# Patient Record
Sex: Female | Born: 1980 | Race: Black or African American | Hispanic: No | Marital: Single | Smoking: Current some day smoker
Health system: Southern US, Community
[De-identification: ages and names within clinical notes are randomized; demographics above are authoritative.]

## PROBLEM LIST (undated history)

## (undated) ENCOUNTER — Ambulatory Visit: Source: Home / Self Care

## (undated) DIAGNOSIS — E119 Type 2 diabetes mellitus without complications: Secondary | ICD-10-CM

---

## 1999-10-18 ENCOUNTER — Emergency Department (HOSPITAL_COMMUNITY): Admission: EM | Admit: 1999-10-18 | Discharge: 1999-10-18 | Payer: Self-pay | Admitting: Emergency Medicine

## 2008-04-22 ENCOUNTER — Emergency Department (HOSPITAL_COMMUNITY): Admission: EM | Admit: 2008-04-22 | Discharge: 2008-04-22 | Payer: Self-pay | Admitting: Emergency Medicine

## 2011-07-18 LAB — POCT PREGNANCY, URINE: Operator id: 235561

## 2011-07-18 LAB — POCT RAPID STREP A: Streptococcus, Group A Screen (Direct): POSITIVE — AB

## 2016-11-15 ENCOUNTER — Emergency Department (HOSPITAL_COMMUNITY)
Admission: EM | Admit: 2016-11-15 | Discharge: 2016-11-15 | Disposition: A | Discharge status: Home / Self Care | Payer: Self-pay | Attending: Emergency Medicine | Admitting: Emergency Medicine

## 2016-11-15 ENCOUNTER — Encounter (HOSPITAL_COMMUNITY): Payer: Self-pay | Admitting: Emergency Medicine

## 2016-11-15 ENCOUNTER — Emergency Department (HOSPITAL_COMMUNITY): Payer: Self-pay

## 2016-11-15 DIAGNOSIS — Y929 Unspecified place or not applicable: Secondary | ICD-10-CM | POA: Insufficient documentation

## 2016-11-15 DIAGNOSIS — S63502A Unspecified sprain of left wrist, initial encounter: Secondary | ICD-10-CM | POA: Insufficient documentation

## 2016-11-15 DIAGNOSIS — Y939 Activity, unspecified: Secondary | ICD-10-CM | POA: Insufficient documentation

## 2016-11-15 DIAGNOSIS — X58XXXA Exposure to other specified factors, initial encounter: Secondary | ICD-10-CM | POA: Insufficient documentation

## 2016-11-15 DIAGNOSIS — F17200 Nicotine dependence, unspecified, uncomplicated: Secondary | ICD-10-CM | POA: Insufficient documentation

## 2016-11-15 DIAGNOSIS — Y999 Unspecified external cause status: Secondary | ICD-10-CM | POA: Insufficient documentation

## 2016-11-15 MED ORDER — ACETAMINOPHEN 500 MG PO TABS
1000.0000 mg | ORAL_TABLET | Freq: Once | ORAL | Status: AC
  Administered 2016-11-15: 1000 mg via ORAL
  Filled 2016-11-15: qty 2

## 2016-11-15 MED ORDER — IBUPROFEN 800 MG PO TABS
800.0000 mg | ORAL_TABLET | Freq: Once | ORAL | Status: AC
  Administered 2016-11-15: 800 mg via ORAL
  Filled 2016-11-15: qty 1

## 2016-11-15 NOTE — ED Triage Notes (Signed)
Arrives with complaint of severe left arm pain after sleeping. States that she went to bed at 9pm on 1/24 and the arm felt normal. Upon waking at 8am on 1/25 the arm was hurting severely. Currently holding left arm up with right arm. Pulse present, color and temperature normal.

## 2016-11-15 NOTE — ED Notes (Signed)
Pt in xray

## 2016-11-15 NOTE — ED Provider Notes (Signed)
[facility] Provider Note   CSN: [id number] Arrival date & time: 11/15/16  0549     History   Chief Complaint Chief Complaint  Patient presents with  . Arm Pain    HPI Janet Romero is a 36 y.o. female.  36 yo F with a cc of distal left arm pain.  Started this morning when she woke up.  Denies injury, denies fever, rash, redness.  Worse with movement and palpation.  Sharp, stabbing pain.     The history is provided by the patient.  Arm Pain  This is a new problem. The current episode started yesterday. The problem occurs constantly. The problem has not changed since onset.Pertinent negatives include no chest pain, no headaches and no shortness of breath. Nothing aggravates the symptoms. Nothing relieves the symptoms. She has tried nothing for the symptoms. The treatment provided no relief.    No past medical history on file.  There are no active problems to display for this patient.   No past surgical history on file.  OB History    No data available       Home Medications    Prior to Admission medications   Not on File    Family History No family history on file.  Social History Social History  Substance Use Topics  . Smoking status: Current Every Day Smoker  . Smokeless tobacco: Never Used  . Alcohol use Not on file     Allergies   Patient has no known allergies.   Review of Systems Review of Systems  Constitutional: Negative for chills and fever.  HENT: Negative for congestion and rhinorrhea.   Eyes: Negative for redness and visual disturbance.  Respiratory: Negative for shortness of breath and wheezing.   Cardiovascular: Negative for chest pain and palpitations.  Gastrointestinal: Negative for nausea and vomiting.  Genitourinary: Negative for dysuria and urgency.  Musculoskeletal: Positive for arthralgias and myalgias.  Skin: Negative for pallor and wound.  Neurological: Negative for dizziness and headaches.     Physical  Exam Updated Vital Signs BP (!) 149/117 (BP Location: Right Wrist)   Pulse 76   Temp 98.2 F (36.8 C) (Oral)   Resp 20   Ht 6\' 1"  (1.854 m)   Wt (!) 372 lb (168.7 kg)   LMP 11/01/2016 (Exact Date)   SpO2 99%   BMI 49.08 kg/m   Physical Exam  Constitutional: She is oriented to person, place, and time. She appears well-developed and well-nourished. No distress.  HENT:  Head: Normocephalic and atraumatic.  Eyes: EOM are normal. Pupils are equal, round, and reactive to light.  Neck: Normal range of motion. Neck supple.  Cardiovascular: Normal rate and regular rhythm.  Exam reveals no gallop and no friction rub.   No murmur heard. Pulmonary/Chest: Effort normal. She has no wheezes. She has no rales.  Abdominal: Soft. She exhibits no distension and no mass. There is no tenderness. There is no guarding.  Musculoskeletal: She exhibits tenderness (TTP about the distal left ulnar aspect just promixal to the wrist). She exhibits no edema.  No noted swelling, PMS intact distally.   Neurological: She is alert and oriented to person, place, and time.  Skin: Skin is warm and dry. She is not diaphoretic.  Psychiatric: She has a normal mood and affect. Her behavior is normal.  Nursing note and vitals reviewed.    ED Treatments / Results  Labs (all labs ordered are listed, but only abnormal results are displayed) Labs Reviewed - No  data to display  EKG  EKG Interpretation None       Radiology No results found.  Procedures Procedures (including critical care time)  Medications Ordered in ED Medications  acetaminophen (TYLENOL) tablet 1,000 mg (not administered)  ibuprofen (ADVIL,MOTRIN) tablet 800 mg (not administered)     Initial Impression / Assessment and Plan / ED Course  I have reviewed the triage vital signs and the nursing notes.  Pertinent labs & imaging results that were available during my care of the patient were reviewed by me and considered in my medical  decision making (see chart for details).     36 yo F with a cc of left wrist pain.  Going on since this morning.  I suspect she slept on it wrong, PMS intact, pain worst to distal ulna.  Will place in a removable splint, have her follow up with her doc in a week.   7:17 AM:  I have discussed the diagnosis/risks/treatment options with the patient and family and believe the pt to be eligible for discharge home to follow-up with PCP. We also discussed returning to the ED immediately if new or worsening sx occur. We discussed the sx which are most concerning (e.g., sudden worsening pain, fever, inability to tolerate by mouth) that necessitate immediate return. Medications administered to the patient during their visit and any new prescriptions provided to the patient are listed below.  Medications given during this visit Medications  acetaminophen (TYLENOL) tablet 1,000 mg (not administered)  ibuprofen (ADVIL,MOTRIN) tablet 800 mg (not administered)     The patient appears reasonably screen and/or stabilized for discharge and I doubt any other medical condition or other [facility] requiring further screening, evaluation, or treatment in the ED at this time prior to discharge.    Final Clinical Impressions(s) / ED Diagnoses   Final diagnoses:  Wrist sprain, left, initial encounter    New Prescriptions New Prescriptions   No medications on file     Melene PlanDan Kourtnei Rauber, DO 11/15/16 [id number]

## 2016-11-15 NOTE — Discharge Instructions (Signed)
Take 4 over the counter ibuprofen tablets 3 times a day or 2 over-the-counter naproxen tablets twice a day for pain. Also take tylenol 1000mg(2 extra strength) four times a day.    

## 2016-11-15 NOTE — ED Notes (Signed)
Patient transported to X-ray from lobby. °

## 2017-05-27 IMAGING — DX DG SHOULDER 2+V*L*
2 series · 2 of 2 positions shown · non-contrast
Comparison: No recent prior .

CLINICAL DATA: Left arm pain.  No known injury.

EXAM:
LEFT SHOULDER - 2+ VIEW

[shoulder grashey]
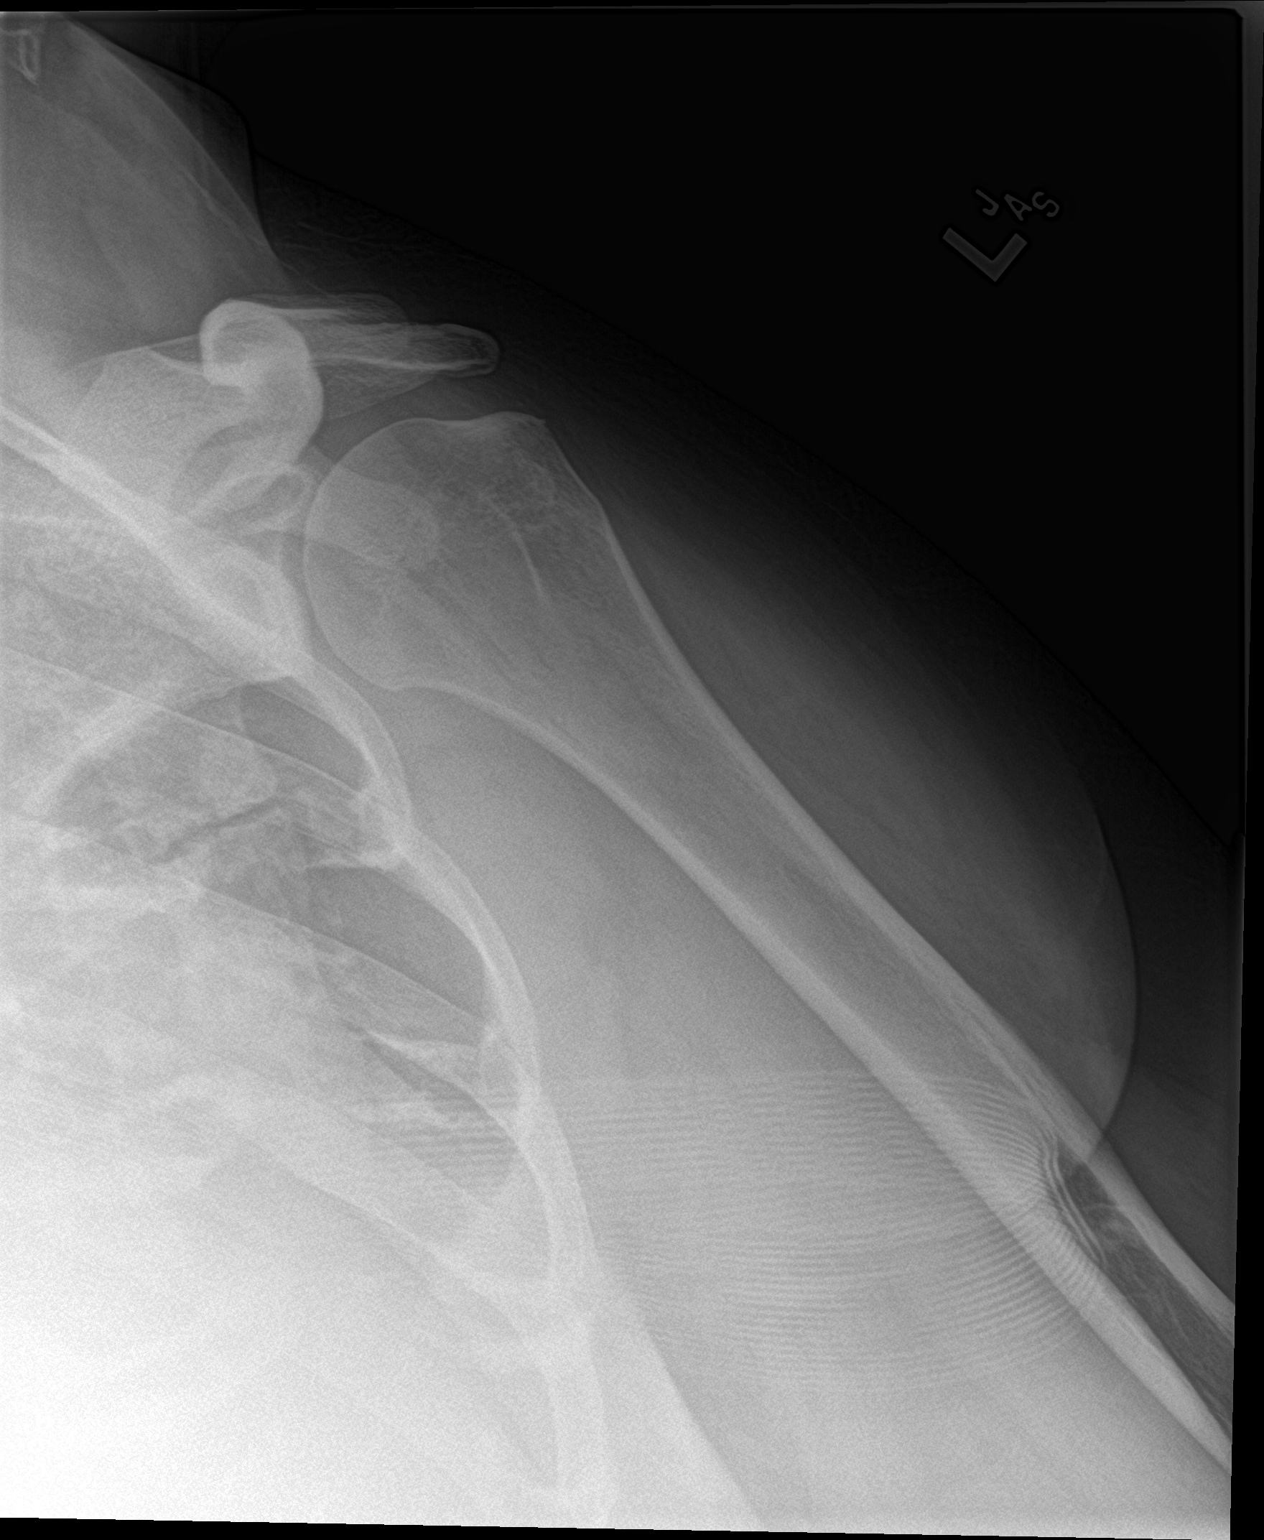

[shoulder y view]
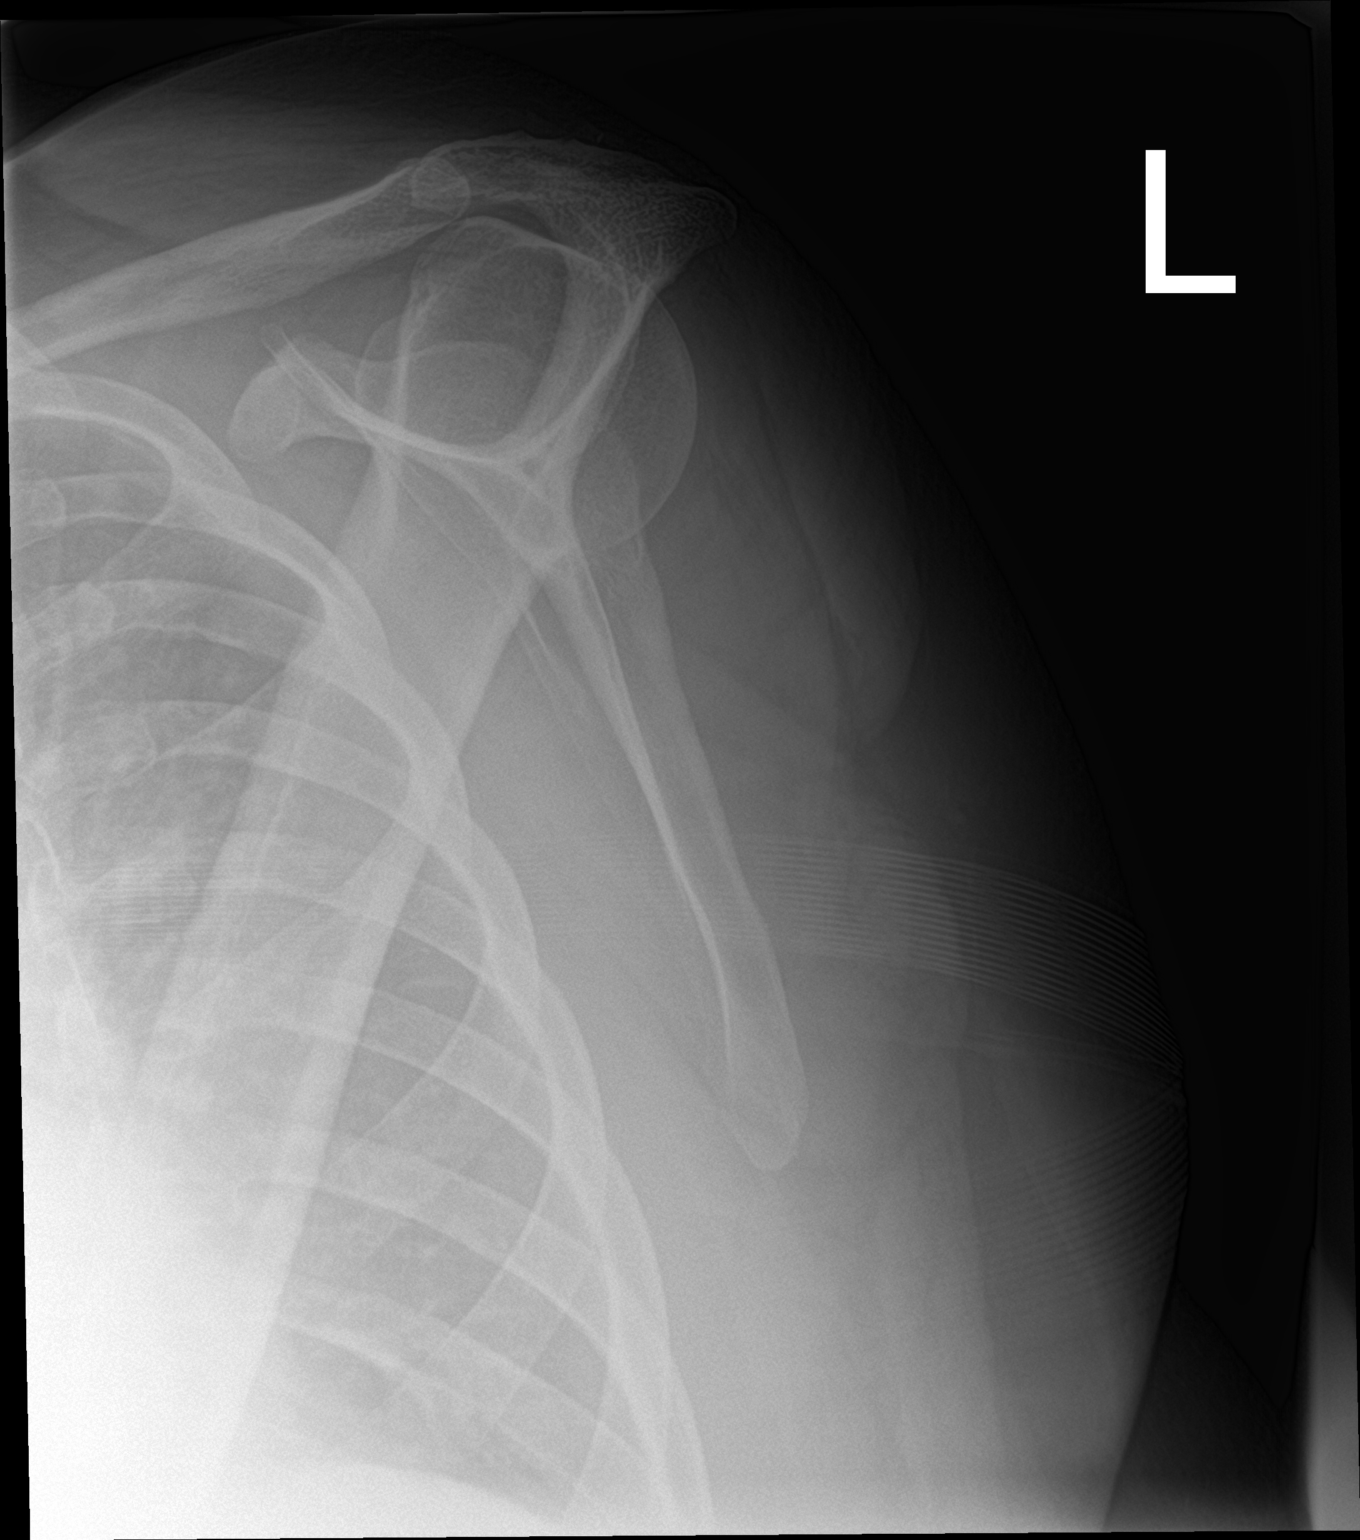

[2 of 2 positions shown; findings below may reference images not displayed]

FINDINGS: No acute bony or joint abnormality identified. No evidence of
fracture or dislocation.
IMPRESSION: No acute abnormality .

## 2024-08-06 ENCOUNTER — Other Ambulatory Visit: Payer: Self-pay

## 2024-08-06 DIAGNOSIS — Z1231 Encounter for screening mammogram for malignant neoplasm of breast: Secondary | ICD-10-CM

## 2024-08-14 ENCOUNTER — Other Ambulatory Visit: Payer: Self-pay

## 2024-08-14 ENCOUNTER — Ambulatory Visit
Admission: RE | Admit: 2024-08-14 | Discharge: 2024-08-14 | Disposition: A | Discharge status: Home / Self Care | Payer: Self-pay | Source: Ambulatory Visit

## 2024-08-14 DIAGNOSIS — Z1231 Encounter for screening mammogram for malignant neoplasm of breast: Secondary | ICD-10-CM

## 2024-09-08 ENCOUNTER — Other Ambulatory Visit: Payer: Self-pay

## 2024-09-08 ENCOUNTER — Ambulatory Visit
Admission: EM | Admit: 2024-09-08 | Discharge: 2024-09-08 | Disposition: A | Discharge status: Home / Self Care | Payer: Self-pay | Source: Ambulatory Visit | Attending: Nurse Practitioner | Admitting: Nurse Practitioner

## 2024-09-08 DIAGNOSIS — R509 Fever, unspecified: Secondary | ICD-10-CM

## 2024-09-08 DIAGNOSIS — H6692 Otitis media, unspecified, left ear: Secondary | ICD-10-CM

## 2024-09-08 DIAGNOSIS — J029 Acute pharyngitis, unspecified: Secondary | ICD-10-CM

## 2024-09-08 DIAGNOSIS — Z794 Long term (current) use of insulin: Secondary | ICD-10-CM

## 2024-09-08 DIAGNOSIS — R32 Unspecified urinary incontinence: Secondary | ICD-10-CM

## 2024-09-08 DIAGNOSIS — E1169 Type 2 diabetes mellitus with other specified complication: Secondary | ICD-10-CM

## 2024-09-08 DIAGNOSIS — J020 Streptococcal pharyngitis: Secondary | ICD-10-CM

## 2024-09-08 HISTORY — DX: Type 2 diabetes mellitus without complications: E11.9

## 2024-09-08 LAB — POCT URINE DIPSTICK
Bilirubin, UA: NEGATIVE
Glucose, UA: NEGATIVE mg/dL
Ketones, POC UA: NEGATIVE mg/dL
Leukocytes, UA: NEGATIVE
Nitrite, UA: NEGATIVE
POC PROTEIN,UA: 30 — AB
Spec Grav, UA: 1.02 (ref 1.010–1.025)
Urobilinogen, UA: 1 U/dL
pH, UA: 8 (ref 5.0–8.0)

## 2024-09-08 LAB — GLUCOSE, POCT (MANUAL RESULT ENTRY): POC Glucose: 172 mg/dL — AB (ref 70–99)

## 2024-09-08 LAB — POCT URINE PREGNANCY: Preg Test, Ur: NEGATIVE

## 2024-09-08 LAB — POCT RAPID STREP A (OFFICE): Rapid Strep A Screen: POSITIVE — AB

## 2024-09-08 MED ORDER — AMOXICILLIN 400 MG/5ML PO SUSR: 875.0000 mg | Freq: Two times a day (BID) | ORAL | 0 refills | Status: AC

## 2024-09-08 MED ORDER — ACETAMINOPHEN 160 MG/5ML PO SOLN
650.0000 mg | Freq: Once | ORAL | Status: AC
  Administered 2024-09-08: 650 mg via ORAL

## 2024-09-08 MED ORDER — DEXAMETHASONE SOD PHOSPHATE PF 10 MG/ML IJ SOLN
10.0000 mg | Freq: Once | INTRAMUSCULAR | Status: AC
  Administered 2024-09-08: 10 mg via INTRAMUSCULAR

## 2024-09-08 NOTE — ED Provider Notes (Signed)
 [facility]    CSN: [id number] Arrival date & time: 09/08/24  1324      History   Chief Complaint Chief Complaint  Patient presents with   Shortness of Breath   Sore Throat   Chills    HPI Bryanne Perleberg is a 43 y.o. female  presents for evaluation of URI symptoms for 1 days. Patient reports associated symptoms of sore throat with difficulty swallowing due to pain, chills, ear pain, fevers, shortness of breath. Denies N/V/D, cough, congestion, body aches. Patient does not have a hx of asthma. Patient is an active smoker.   Reports sick contacts via work.  Pt has taken nothing OTC for symptoms.  Patient also states she has a history of diabetes and takes insulin but been off of it for 1 week due to inability to obtain it.  She is takes naturopathic supplements otherwise for her diabetes.  Hemoglobin A1c from May 2025 was 9.6.  She also reports 1 day of urinary incontinence.  No dysuria, flank pain.  Pt has no other concerns at this time.    Shortness of Breath Associated symptoms: ear pain, fever and sore throat   Sore Throat Associated symptoms include shortness of breath.    Past Medical History:  Diagnosis Date   Diabetes mellitus without complication (HCC)     There are no active problems to display for this patient.   History reviewed. No pertinent surgical history.  OB History   No obstetric history on file.      Home Medications    Prior to Admission medications   Medication Sig Start Date End Date Taking? Authorizing Provider  amoxicillin (AMOXIL) 400 MG/5ML suspension Take 10.9 mLs (875 mg total) by mouth 2 (two) times daily for 10 days. 09/08/24 09/18/24 Yes Loreda Myla SAUNDERS, NP    Family History History reviewed. No pertinent family history.  Social History Social History   Tobacco Use   Smoking status: Some Days    Types: Cigarettes   Smokeless tobacco: Never  Vaping Use   Vaping status: Never Used  Substance Use Topics   Alcohol  use: Never   Drug use: Never     Allergies   Patient has no known allergies.   Review of Systems Review of Systems  Constitutional:  Positive for chills and fever.  HENT:  Positive for ear pain and sore throat.   Respiratory:  Positive for shortness of breath.      Physical Exam Triage Vital Signs ED Triage Vitals  Encounter Vitals Group     BP 09/08/24 1329 (!) 165/84     Girls Systolic BP Percentile --      Girls Diastolic BP Percentile --      Boys Systolic BP Percentile --      Boys Diastolic BP Percentile --      Pulse Rate 09/08/24 1329 97     Resp 09/08/24 1329 (!) 26     Temp 09/08/24 1329 (!) 100.4 F (38 C)     Temp Source 09/08/24 1329 Oral     SpO2 09/08/24 1329 96 %     Weight --      Height --      Head Circumference --      Peak Flow --      Pain Score 09/08/24 1327 10     Pain Loc --      Pain Education --      Exclude from Growth Chart --    No  data found.  Updated Vital Signs BP (!) 165/84   Pulse 97   Temp (!) 100.4 F (38 C) (Oral)   Resp (!) 26   LMP 08/13/2024   SpO2 96%   Visual Acuity Right Eye Distance:   Left Eye Distance:   Bilateral Distance:    Right Eye Near:   Left Eye Near:    Bilateral Near:     Physical Exam Vitals and nursing note reviewed.  Constitutional:      General: She is not in acute distress.    Appearance: She is well-developed. She is obese.     Comments: Pt speaking in complete sentences without issue  HENT:     Head: Normocephalic and atraumatic.     Right Ear: Tympanic membrane and ear canal normal.     Left Ear: Ear canal normal. Tympanic membrane is erythematous.     Nose: No congestion or rhinorrhea.     Mouth/Throat:     Mouth: Mucous membranes are moist.     Pharynx: Uvula midline. Pharyngeal swelling and posterior oropharyngeal erythema present. No uvula swelling.     Tonsils: Tonsillar exudate present. No tonsillar abscesses. 3+ on the right. 3+ on the left.     Comments: Area is patent  with midline uvula. Eyes:     Conjunctiva/sclera: Conjunctivae normal.     Pupils: Pupils are equal, round, and reactive to light.  Neck:     Comments: There is also submental adenopathy Cardiovascular:     Rate and Rhythm: Normal rate and regular rhythm.     Heart sounds: Normal heart sounds.  Pulmonary:     Effort: Pulmonary effort is normal.     Breath sounds: Normal breath sounds.  Musculoskeletal:     Cervical back: Normal range of motion and neck supple.  Lymphadenopathy:     Cervical: Cervical adenopathy present.  Skin:    General: Skin is warm and dry.  Neurological:     General: No focal deficit present.     Mental Status: She is alert and oriented to person, place, and time.  Psychiatric:        Mood and Affect: Mood normal.        Behavior: Behavior normal.      UC Treatments / Results  Labs (all labs ordered are listed, but only abnormal results are displayed) Labs Reviewed  GLUCOSE, POCT (MANUAL RESULT ENTRY) - Abnormal; Notable for the following components:      Result Value   POC Glucose 172 (*)    All other components within normal limits  POCT RAPID STREP A (OFFICE) - Abnormal; Notable for the following components:   Rapid Strep A Screen Positive (*)    All other components within normal limits  POCT URINE DIPSTICK - Abnormal; Notable for the following components:   Color, UA straw (*)    Clarity, UA hazy (*)    Blood, UA trace-intact (*)    POC PROTEIN,UA =30 (*)    All other components within normal limits  POCT URINE PREGNANCY    EKG   Radiology No results found.  Procedures Procedures (including critical care time)  Medications Ordered in UC Medications  acetaminophen  (TYLENOL ) 160 MG/5ML solution 650 mg (650 mg Oral Given 09/08/24 1343)  dexamethasone  (DECADRON ) injection 10 mg (10 mg Intramuscular Given 09/08/24 1403)    Initial Impression / Assessment and Plan / UC Course  I have reviewed the triage vital signs and the nursing  notes.  Pertinent labs &  imaging results that were available during my care of the patient were reviewed by me and considered in my medical decision making (see chart for details).  Clinical Course as of 09/08/24 1429  Wed Sep 08, 2024  1429 Temperature recheck after Tylenol  99.3, respiratory rate 21 [JM]    Clinical Course User Index [JM] Loreda Myla SAUNDERS, NP    Positive rapid strep and left otitis media, start amoxicillin  twice daily for 10 days.  Patient requesting liquid due to pain of swallowing.  She was given liquid Tylenol  clinic as well with improvement in fever.  Did give her IV Decadron  for her tonsillar swelling with improvement .  Blood sugar fasting 172.  Urine negative for UTI.  Advised to continue over-the-counter Tylenol  ibuprofen  as needed for fever/throat pain.  Salt water gargle with warm liquid such as teas and honey encouraged.  Strict ER precautions reviewed and patient verbalized understanding. Final Clinical Impressions(s) / UC Diagnoses   Final diagnoses:  Sore throat  Fever, unspecified  Type 2 diabetes mellitus with other specified complication, with long-term current use of insulin (HCC)  Left otitis media, unspecified otitis media type  Urinary incontinence, unspecified type  Streptococcal sore throat     Discharge Instructions      You tested positive for strep throat.  Start amoxicillin  twice daily for 10 days to treat both your strep throat as well as your ear infection.  You may take Tylenol  or ibuprofen  over-the-counter as needed for pain/fever.  You were given Tylenol  in clinic.  Salt water gargles warm liquids such as teas and honey.  Your urine is negative for infection.  Lots of rest and fluids.  Please go to the emergency room if your symptoms do not improve or worsen.  I hope you feel better soon!     ED Prescriptions     Medication Sig Dispense Auth. Provider   amoxicillin  (AMOXIL ) 400 MG/5ML suspension Take 10.9 mLs (875 mg total) by mouth 2  (two) times daily for 10 days. 218 mL Ramyah Pankowski, Jodi R, NP      PDMP not reviewed this encounter.   Loreda Myla SAUNDERS, NP 09/08/24 1430

## 2024-09-08 NOTE — ED Triage Notes (Signed)
 Pt states her throat is closing, chills, SOB, urinary incontinence started yesterday.

## 2024-09-08 NOTE — Discharge Instructions (Signed)
 You tested positive for strep throat.  Start amoxicillin  twice daily for 10 days to treat both your strep throat as well as your ear infection.  You may take Tylenol  or ibuprofen  over-the-counter as needed for pain/fever.  You were given Tylenol  in clinic.  Salt water gargles warm liquids such as teas and honey.  Your urine is negative for infection.  Lots of rest and fluids.  Please go to the emergency room if your symptoms do not improve or worsen.  I hope you feel better soon!

## 2024-09-26 ENCOUNTER — Encounter (HOSPITAL_COMMUNITY): Payer: Self-pay | Admitting: Emergency Medicine

## 2024-09-26 ENCOUNTER — Ambulatory Visit (HOSPITAL_COMMUNITY): Admission: EM | Admit: 2024-09-26 | Discharge: 2024-09-26 | Disposition: A | Discharge status: Home / Self Care

## 2024-09-26 DIAGNOSIS — J020 Streptococcal pharyngitis: Secondary | ICD-10-CM

## 2024-09-26 LAB — POCT MONO SCREEN (KUC): Mono, POC: NEGATIVE

## 2024-09-26 LAB — POCT RAPID STREP A (OFFICE): Rapid Strep A Screen: POSITIVE — AB

## 2024-09-26 MED ORDER — DEXAMETHASONE 1 MG/ML PO CONC: 10.0000 mg | Freq: Once | ORAL | Status: DC

## 2024-09-26 MED ORDER — DEXAMETHASONE SOD PHOSPHATE PF 10 MG/ML IJ SOLN
10.0000 mg | Freq: Once | INTRAMUSCULAR | Status: AC
  Administered 2024-09-26: 10 mg via INTRAMUSCULAR

## 2024-09-26 MED ORDER — LIDOCAINE VISCOUS HCL 2 % MT SOLN
OROMUCOSAL | Status: AC
  Filled 2024-09-26: qty 15

## 2024-09-26 MED ORDER — DICLOFENAC SODIUM 50 MG PO TBEC: 50.0000 mg | DELAYED_RELEASE_TABLET | Freq: Two times a day (BID) | ORAL | 1 refills | Status: AC

## 2024-09-26 MED ORDER — CEPHALEXIN 500 MG PO CAPS: 500.0000 mg | ORAL_CAPSULE | Freq: Two times a day (BID) | ORAL | 0 refills | Status: AC

## 2024-09-26 MED ORDER — LIDOCAINE VISCOUS HCL 2 % MT SOLN
15.0000 mL | Freq: Once | OROMUCOSAL | Status: AC
  Administered 2024-09-26: 15 mL via OROMUCOSAL

## 2024-09-26 NOTE — ED Triage Notes (Signed)
 Pt reports had strep throat couple weeks ago and took antibiotics but symptoms still there. Reports having fever, cough up phlegm, chills, left ear and neck pain. Took tylenol , last dose last night.

## 2024-09-26 NOTE — Discharge Instructions (Addendum)
  1. Streptococcus pharyngitis (recurrent) (Primary) - POC mono screen completed in UC is negative for mononucleosis - POC rapid strep A complete and UC is still positive for strep pharyngitis - lidocaine  (XYLOCAINE ) 2 % viscous mouth solution 15 mL & dexamethasone  (DECADRON ) injection 10 mg given in UC for acute throat pain and inflammation secondary to strep pharyngitis - cephALEXin  (KEFLEX ) 500 MG capsule; Take 1 capsule (500 mg total) by mouth 2 (two) times daily for 10 days.  Dispense: 20 capsule; Refill: 0 - diclofenac  (VOLTAREN ) 50 MG EC tablet; Take 1 tablet (50 mg total) by mouth 2 (two) times daily.  Dispense: 30 tablet; Refill: 1  -Continue to monitor symptoms for any change in severity if there is any escalation of current symptoms or development of new symptoms follow-up in ER for further evaluation and management.

## 2024-09-26 NOTE — ED Provider Notes (Signed)
 [facility]  Note:  This document was prepared using Conservation officer, historic buildings and may include unintentional dictation errors.  MRN: [id number] DOB: August 18, 1981  Subjective:   Janet Romero is a 43 y.o. female presenting for continued sore throat after diagnosis of strep 1 to 2 weeks ago.  Patient reports that she took antibiotics as directed for the entire 10-day states that symptoms did improve initially but then came back quickly after finishing antibiotics.  Patient reports she did change her toothbrush after a few days of antibiotic therapy.  Patient reports intermittent fever, cough with phlegm chills, left ear pain.  Patient states that she is taking Tylenol .  No current facility-administered medications for this encounter.  Current Outpatient Medications:    cephALEXin  (KEFLEX ) 500 MG capsule, Take 1 capsule (500 mg total) by mouth 2 (two) times daily for 10 days., Disp: 20 capsule, Rfl: 0   diclofenac  (VOLTAREN ) 50 MG EC tablet, Take 1 tablet (50 mg total) by mouth 2 (two) times daily., Disp: 30 tablet, Rfl: 1   insulin NPH Human (NOVOLIN N) 100 UNIT/ML injection, Inject 30 Units into the skin., Disp: , Rfl:    No Known Allergies  Past Medical History:  Diagnosis Date   Diabetes mellitus without complication (HCC)      History reviewed. No pertinent surgical history.  History reviewed. No pertinent family history.  Social History   Tobacco Use   Smoking status: Some Days    Types: Cigarettes   Smokeless tobacco: Never  Vaping Use   Vaping status: Never Used  Substance Use Topics   Alcohol use: Never   Drug use: Never    ROS Refer to HPI for ROS details.  Objective:    Vitals: BP (!) 155/79 (BP Location: Right Arm)   Pulse 84   Temp 98.6 F (37 C) (Oral)   Resp 16   LMP 09/20/2024 (Approximate)   SpO2 99%   Physical Exam Vitals and nursing note reviewed.  Constitutional:      General: She is not in acute distress.     Appearance: She is well-developed. She is not ill-appearing, toxic-appearing or diaphoretic.  HENT:     Head: Normocephalic and atraumatic.     Right Ear: Tympanic membrane, ear canal and external ear normal.     Left Ear: Tympanic membrane, ear canal and external ear normal.     Nose: Congestion and rhinorrhea present.     Mouth/Throat:     Mouth: Mucous membranes are moist.     Pharynx: Oropharyngeal exudate, posterior oropharyngeal erythema and postnasal drip present.     Tonsils: Tonsillar exudate present. No tonsillar abscesses. 4+ on the right. 4+ on the left.  Cardiovascular:     Rate and Rhythm: Normal rate.  Pulmonary:     Effort: Pulmonary effort is normal. No respiratory distress.     Breath sounds: No stridor. No wheezing.  Skin:    General: Skin is warm and dry.  Neurological:     General: No focal deficit present.     Mental Status: She is alert and oriented to person, place, and time.  Psychiatric:        Mood and Affect: Mood normal.        Behavior: Behavior normal.     Procedures  Results for orders placed or performed during the hospital encounter of 09/26/24 (from the past 24 hours)  POC mono screen     Status: None   Collection Time: 09/26/24 10:57 AM  Result  Value Ref Range   Mono, POC Negative Negative  POC rapid strep A     Status: Abnormal   Collection Time: 09/26/24 10:57 AM  Result Value Ref Range   Rapid Strep A Screen Positive (A) Negative    Assessment and Plan :     Discharge Instructions       1. Streptococcus pharyngitis (recurrent) (Primary) - POC mono screen completed in UC is negative for mononucleosis - POC rapid strep A complete and UC is still positive for strep pharyngitis - lidocaine  (XYLOCAINE ) 2 % viscous mouth solution 15 mL & dexamethasone  (DECADRON ) injection 10 mg given in UC for acute throat pain and inflammation secondary to strep pharyngitis - cephALEXin  (KEFLEX ) 500 MG capsule; Take 1 capsule (500 mg total) by mouth  2 (two) times daily for 10 days.  Dispense: 20 capsule; Refill: 0 - diclofenac  (VOLTAREN ) 50 MG EC tablet; Take 1 tablet (50 mg total) by mouth 2 (two) times daily.  Dispense: 30 tablet; Refill: 1  -Continue to monitor symptoms for any change in severity if there is any escalation of current symptoms or development of new symptoms follow-up in ER for further evaluation and management.      Keone Kamer B Shante [place]   Emberley Kral, [place] B, [place] 09/26/24 1127

## 2024-10-02 ENCOUNTER — Inpatient Hospital Stay: Admit: 2024-10-02 | Discharge: 2024-10-02 | Payer: Self-pay | Attending: Family Medicine

## 2024-10-02 ENCOUNTER — Other Ambulatory Visit: Payer: Self-pay

## 2024-10-02 VITALS — BP 122/85 | HR 88 | Temp 98.4°F | Resp 20

## 2024-10-02 DIAGNOSIS — L02412 Cutaneous abscess of left axilla: Secondary | ICD-10-CM

## 2024-10-02 DIAGNOSIS — L02411 Cutaneous abscess of right axilla: Secondary | ICD-10-CM

## 2024-10-02 MED ORDER — DOXYCYCLINE HYCLATE 100 MG PO CAPS: 100.0000 mg | ORAL_CAPSULE | Freq: Two times a day (BID) | ORAL | 0 refills | Status: AC

## 2024-10-02 MED ORDER — FLUCONAZOLE 150 MG PO TABS: 150.0000 mg | ORAL_TABLET | Freq: Every day | ORAL | 0 refills | Status: AC

## 2024-10-02 NOTE — Discharge Instructions (Addendum)
 Start Doxy 1 twice daily for 10 days.  Continue the Keflex  you were previously prescribed.  You may take Diflucan  to help prevent antibiotic induced yeast infections.  Continue warm compresses to the area.  Follow-up with your PCP in 2 to 3 days for recheck.  Please go to the emergency room for any worsening symptoms.  Hope you feel better soon!

## 2024-10-02 NOTE — ED Triage Notes (Addendum)
 Pt c/o 2 large abscess in left axillaand 1 abscess in right axillax1wk. Abscesses are intact.

## 2024-10-02 NOTE — ED Provider Notes (Signed)
 [facility]    CSN: [id number] Arrival date & time: 10/02/24  1032      History   Chief Complaint Chief Complaint  Patient presents with   Abscess    HPI Janet Romero is a 43 y.o. female presents for abscess.  1 week of bilateral axilla abscesses left greater than right.  Denies any fevers, drainage, warmth or erythema.  Denies history of MRSA.  Denies history of hidradenitis suppurativa.  She is diabetic.  She is currently on 500 mg of Keflex  twice daily for strep throat that she started on 12/7.  She has been doing warm compresses with improvement.  No other concerns at this time.   Abscess   Past Medical History:  Diagnosis Date   Diabetes mellitus without complication (HCC)     There are no active problems to display for this patient.   History reviewed. No pertinent surgical history.  OB History   No obstetric history on file.      Home Medications    Prior to Admission medications  Medication Sig Start Date End Date Taking? Authorizing Provider  doxycycline  (VIBRAMYCIN ) 100 MG capsule Take 1 capsule (100 mg total) by mouth 2 (two) times daily. 10/02/24  Yes Marcellina Jonsson, Jodi R, NP  fluconazole  (DIFLUCAN ) 150 MG tablet Take 1 tablet (150 mg total) by mouth daily for 2 doses. Take 1 tablet today and you may repeat in 3 days if symptoms persist 10/02/24 10/04/24 Yes Chadley Dziedzic, Jodi R, NP  cephALEXin  (KEFLEX ) 500 MG capsule Take 1 capsule (500 mg total) by mouth 2 (two) times daily for 10 days. 09/26/24 10/06/24  Reddick, Johnathan B, NP  diclofenac  (VOLTAREN ) 50 MG EC tablet Take 1 tablet (50 mg total) by mouth 2 (two) times daily. 09/26/24   Reddick, Johnathan B, NP  insulin NPH Human (NOVOLIN N) 100 UNIT/ML injection Inject 30 Units into the skin. 08/02/22   [provider]    Family History History reviewed. No pertinent family history.  Social History Social History[1]   Allergies   Patient has no known allergies.   Review of  Systems Review of Systems  Skin:        Bilateral axilla abscess     Physical Exam Triage Vital Signs ED Triage Vitals  Encounter Vitals Group     BP 10/02/24 1053 122/85     Girls Systolic BP Percentile --      Girls Diastolic BP Percentile --      Boys Systolic BP Percentile --      Boys Diastolic BP Percentile --      Pulse Rate 10/02/24 1053 88     Resp 10/02/24 1053 20     Temp 10/02/24 1053 98.4 F (36.9 C)     Temp Source 10/02/24 1053 Oral     SpO2 10/02/24 1053 95 %     Weight --      Height --      Head Circumference --      Peak Flow --      Pain Score 10/02/24 1051 10     Pain Loc --      Pain Education --      Exclude from Growth Chart --    No data found.  Updated Vital Signs BP 122/85   Pulse 88   Temp 98.4 F (36.9 C) (Oral)   Resp 20   LMP 09/20/2024 (Approximate)   SpO2 95%   Visual Acuity Right Eye Distance:   Left Eye Distance:  Bilateral Distance:    Right Eye Near:   Left Eye Near:    Bilateral Near:     Physical Exam Vitals and nursing note reviewed.  Constitutional:      General: She is not in acute distress.    Appearance: Normal appearance. She is obese. She is not ill-appearing, toxic-appearing or diaphoretic.  HENT:     Head: Normocephalic and atraumatic.  Eyes:     Pupils: Pupils are equal, round, and reactive to light.  Cardiovascular:     Rate and Rhythm: Normal rate.  Pulmonary:     Effort: Pulmonary effort is normal.  Skin:    General: Skin is warm and dry.     Comments: There is a 1 x 2cm indurated nonfluctuant abscess to the right axilla.  No erythema or warmth.  Mildly tender.  There is a 4 x 3cm indurated nonfluctuant nonerythematous abscess to the left axilla that is moderately tender to palpation.  No active drainage.  Adjacent to this there is a 1 x 2 indurated nonfluctuant abscess as well.  Neurological:     General: No focal deficit present.     Mental Status: She is alert and oriented to person, place,  and time.  Psychiatric:        Mood and Affect: Mood normal.        Behavior: Behavior normal.      UC Treatments / Results  Labs (all labs ordered are listed, but only abnormal results are displayed) Labs Reviewed - No data to display  EKG   Radiology No results found.  Procedures Procedures (including critical care time)  Medications Ordered in UC Medications - No data to display  Initial Impression / Assessment and Plan / UC Course  I have reviewed the triage vital signs and the nursing notes.  Pertinent labs & imaging results that were available during my care of the patient were reviewed by me and considered in my medical decision making (see chart for details).     Reviewed exam and symptoms with patient.  No red flags.  She is afebrile and well-appearing.  Will start doxycycline  twice daily for 10 days.  Diflucan  as patient reports history of antibiotic induced yeast infections.  She can complete the Keflex  she was previously prescribed.  Encouraged continuing warm compresses and follow-up with PCP in 2 to 3 days for recheck.  ER precautions reviewed. Final Clinical Impressions(s) / UC Diagnoses   Final diagnoses:  Abscesses of both axillae     Discharge Instructions      Start Doxy 1 twice daily for 10 days.  Continue the Keflex  you were previously prescribed.  You may take Diflucan  to help prevent antibiotic induced yeast infections.  Continue warm compresses to the area.  Follow-up with your PCP in 2 to 3 days for recheck.  Please go to the emergency room for any worsening symptoms.  Hope you feel better soon!    ED Prescriptions     Medication Sig Dispense Auth. Provider   doxycycline  (VIBRAMYCIN ) 100 MG capsule Take 1 capsule (100 mg total) by mouth 2 (two) times daily. 20 capsule Nayelly Laughman, Jodi R, NP   fluconazole  (DIFLUCAN ) 150 MG tablet Take 1 tablet (150 mg total) by mouth daily for 2 doses. Take 1 tablet today and you may repeat in 3 days if symptoms  persist 2 tablet Kai Calico, Jodi R, NP      PDMP not reviewed this encounter.    [1]  Social History Tobacco Use  Smoking status: Some Days    Types: Cigarettes   Smokeless tobacco: Never  Vaping Use   Vaping status: Never Used  Substance Use Topics   Alcohol use: Never   Drug use: Never     Loreda Myla SAUNDERS, NP 10/02/24 1113
